# Patient Record
Sex: Male | Born: 1965 | Race: White | Hispanic: No | Marital: Married | Smoking: Never smoker
Health system: Southern US, Community
[De-identification: ages and names within clinical notes are randomized; demographics above are authoritative.]

## PROBLEM LIST (undated history)

## (undated) DIAGNOSIS — F32A Depression, unspecified: Secondary | ICD-10-CM

## (undated) DIAGNOSIS — F419 Anxiety disorder, unspecified: Secondary | ICD-10-CM

## (undated) DIAGNOSIS — F329 Major depressive disorder, single episode, unspecified: Secondary | ICD-10-CM

## (undated) DIAGNOSIS — E7800 Pure hypercholesterolemia, unspecified: Secondary | ICD-10-CM

---

## 2006-05-23 ENCOUNTER — Emergency Department: Payer: Self-pay | Admitting: Emergency Medicine

## 2008-07-22 ENCOUNTER — Ambulatory Visit: Payer: Self-pay | Admitting: Internal Medicine

## 2018-10-06 ENCOUNTER — Other Ambulatory Visit (HOSPITAL_COMMUNITY): Payer: Self-pay | Admitting: Gastroenterology

## 2018-10-06 ENCOUNTER — Other Ambulatory Visit: Payer: Self-pay | Admitting: Gastroenterology

## 2018-10-06 DIAGNOSIS — R1310 Dysphagia, unspecified: Secondary | ICD-10-CM

## 2018-10-08 ENCOUNTER — Ambulatory Visit
Admission: RE | Admit: 2018-10-08 | Discharge: 2018-10-08 | Disposition: A | Discharge status: Home / Self Care | Payer: BLUE CROSS/BLUE SHIELD | Source: Ambulatory Visit | Attending: Gastroenterology | Admitting: Gastroenterology

## 2018-10-08 DIAGNOSIS — R1310 Dysphagia, unspecified: Secondary | ICD-10-CM | POA: Diagnosis not present

## 2018-11-19 ENCOUNTER — Encounter: Payer: Self-pay | Admitting: *Deleted

## 2018-11-22 ENCOUNTER — Encounter
Admission: RE | Disposition: A | Discharge status: Home / Self Care | Payer: Self-pay | Source: Home / Self Care | Attending: Gastroenterology

## 2018-11-22 ENCOUNTER — Ambulatory Visit: Payer: BLUE CROSS/BLUE SHIELD | Admitting: Certified Registered"

## 2018-11-22 ENCOUNTER — Ambulatory Visit
Admission: RE | Admit: 2018-11-22 | Discharge: 2018-11-22 | Disposition: A | Discharge status: Home / Self Care | Payer: BLUE CROSS/BLUE SHIELD | Attending: Gastroenterology | Admitting: Gastroenterology

## 2018-11-22 ENCOUNTER — Other Ambulatory Visit: Payer: Self-pay

## 2018-11-22 ENCOUNTER — Encounter: Payer: Self-pay | Admitting: *Deleted

## 2018-11-22 DIAGNOSIS — K2980 Duodenitis without bleeding: Secondary | ICD-10-CM | POA: Insufficient documentation

## 2018-11-22 DIAGNOSIS — F329 Major depressive disorder, single episode, unspecified: Secondary | ICD-10-CM | POA: Diagnosis not present

## 2018-11-22 DIAGNOSIS — R1310 Dysphagia, unspecified: Secondary | ICD-10-CM | POA: Diagnosis not present

## 2018-11-22 DIAGNOSIS — E7800 Pure hypercholesterolemia, unspecified: Secondary | ICD-10-CM | POA: Diagnosis not present

## 2018-11-22 DIAGNOSIS — K644 Residual hemorrhoidal skin tags: Secondary | ICD-10-CM | POA: Insufficient documentation

## 2018-11-22 DIAGNOSIS — K449 Diaphragmatic hernia without obstruction or gangrene: Secondary | ICD-10-CM | POA: Insufficient documentation

## 2018-11-22 DIAGNOSIS — Z79899 Other long term (current) drug therapy: Secondary | ICD-10-CM | POA: Insufficient documentation

## 2018-11-22 DIAGNOSIS — Z1211 Encounter for screening for malignant neoplasm of colon: Secondary | ICD-10-CM | POA: Diagnosis present

## 2018-11-22 DIAGNOSIS — K2210 Ulcer of esophagus without bleeding: Secondary | ICD-10-CM | POA: Diagnosis not present

## 2018-11-22 DIAGNOSIS — K317 Polyp of stomach and duodenum: Secondary | ICD-10-CM | POA: Diagnosis not present

## 2018-11-22 DIAGNOSIS — K210 Gastro-esophageal reflux disease with esophagitis: Secondary | ICD-10-CM | POA: Diagnosis not present

## 2018-11-22 DIAGNOSIS — F419 Anxiety disorder, unspecified: Secondary | ICD-10-CM | POA: Diagnosis not present

## 2018-11-22 DIAGNOSIS — K228 Other specified diseases of esophagus: Secondary | ICD-10-CM | POA: Diagnosis not present

## 2018-11-22 DIAGNOSIS — K2960 Other gastritis without bleeding: Secondary | ICD-10-CM | POA: Insufficient documentation

## 2018-11-22 DIAGNOSIS — K225 Diverticulum of esophagus, acquired: Secondary | ICD-10-CM | POA: Insufficient documentation

## 2018-11-22 HISTORY — DX: Pure hypercholesterolemia, unspecified: E78.00

## 2018-11-22 HISTORY — DX: Anxiety disorder, unspecified: F41.9

## 2018-11-22 HISTORY — PX: COLONOSCOPY WITH PROPOFOL: SHX5780

## 2018-11-22 HISTORY — DX: Major depressive disorder, single episode, unspecified: F32.9

## 2018-11-22 HISTORY — DX: Depression, unspecified: F32.A

## 2018-11-22 HISTORY — PX: ESOPHAGOGASTRODUODENOSCOPY (EGD) WITH PROPOFOL: SHX5813

## 2018-11-22 SURGERY — ESOPHAGOGASTRODUODENOSCOPY (EGD) WITH PROPOFOL
Anesthesia: General

## 2018-11-22 MED ORDER — FENTANYL CITRATE (PF) 100 MCG/2ML IJ SOLN
INTRAMUSCULAR | Status: DC | PRN
  Administered 2018-11-22: 25 ug via INTRAVENOUS

## 2018-11-22 MED ORDER — SODIUM CHLORIDE 0.9 % IV SOLN: INTRAVENOUS | Status: DC

## 2018-11-22 MED ORDER — GLYCOPYRROLATE 0.2 MG/ML IJ SOLN
INTRAMUSCULAR | Status: DC | PRN
  Administered 2018-11-22: 0.2 mg via INTRAVENOUS

## 2018-11-22 MED ORDER — SODIUM CHLORIDE 0.9 % IV SOLN
INTRAVENOUS | Status: DC
  Administered 2018-11-22: 1000 mL via INTRAVENOUS

## 2018-11-22 MED ORDER — PROPOFOL 500 MG/50ML IV EMUL
INTRAVENOUS | Status: DC | PRN
  Administered 2018-11-22: 125 ug/kg/min via INTRAVENOUS

## 2018-11-22 MED ORDER — PROPOFOL 500 MG/50ML IV EMUL
INTRAVENOUS | Status: AC
  Filled 2018-11-22: qty 50

## 2018-11-22 MED ORDER — GLYCOPYRROLATE 0.2 MG/ML IJ SOLN
INTRAMUSCULAR | Status: AC
  Filled 2018-11-22: qty 1

## 2018-11-22 MED ORDER — LIDOCAINE HCL (PF) 2 % IJ SOLN
INTRAMUSCULAR | Status: AC
  Filled 2018-11-22: qty 10

## 2018-11-22 MED ORDER — PROPOFOL 10 MG/ML IV BOLUS
INTRAVENOUS | Status: DC | PRN
  Administered 2018-11-22 (×2): 50 mg via INTRAVENOUS

## 2018-11-22 MED ORDER — FENTANYL CITRATE (PF) 100 MCG/2ML IJ SOLN
INTRAMUSCULAR | Status: AC
  Filled 2018-11-22: qty 2

## 2018-11-22 MED ORDER — LIDOCAINE HCL (CARDIAC) PF 100 MG/5ML IV SOSY
PREFILLED_SYRINGE | INTRAVENOUS | Status: DC | PRN
  Administered 2018-11-22: 100 mg via INTRATRACHEAL

## 2018-11-22 NOTE — Op Note (Signed)
[facility] Patient Name: Christopher Huffman Procedure Date: 11/22/2018 10:03 AM MRN: [id number] Account #: [id number] Date of Birth: 02-24-1966 Admit Type: Outpatient Age: 53 Room: [facility] ENDO ROOM 3 Gender: Male Note Status: Finalized Procedure:            Colonoscopy Indications:          Screening for colorectal malignant neoplasm Providers:            Christena Deem, MD Medicines:            Monitored Anesthesia Care Complications:        No immediate complications. Procedure:            Pre-Anesthesia Assessment:                       - ASA Grade Assessment: II - A patient with mild                        systemic disease.                       After obtaining informed consent, the colonoscope was                        passed under direct vision. Throughout the procedure,                        the patient's blood pressure, pulse, and oxygen                        saturations were monitored continuously. The was                        introduced through the anus and advanced to the the                        cecum, identified by appendiceal orifice and ileocecal                        valve. The colonoscopy was performed without                        difficulty. The patient tolerated the procedure well.                        The quality of the bowel preparation was good. Findings:      The colon (entire examined portion) appeared normal.      The entire examined colon appeared normal.      The retroflexed view of the distal rectum and anal verge was normal and       showed no anal or rectal abnormalities.      Non-bleeding external hemorrhoids were found during perianal exam. The       hemorrhoids were small.      The digital rectal exam was normal otherwise. Impression:           - The entire examined colon is normal.                       - The entire examined colon is normal.                       -  The distal rectum and anal verge are  normal on                        retroflexion view.                       - Non-bleeding external hemorrhoids.                       - No specimens collected. Recommendation:       - Discharge patient to home.                       - Take Sitz baths BID for one week. Christena Deem, MD 11/22/2018 11:24:37 AM This report has been signed electronically. Number of Addenda: 0 Note Initiated On: 11/22/2018 10:03 AM Scope Withdrawal Time: 0 hours 7 minutes 17 seconds  Total Procedure Duration: 0 hours 15 minutes 31 seconds       [facility]

## 2018-11-22 NOTE — Transfer of Care (Signed)
Immediate Anesthesia Transfer of Care Note  Patient: Christopher Huffman  Procedure(s) Performed: ESOPHAGOGASTRODUODENOSCOPY (EGD) WITH PROPOFOL (N/A ) COLONOSCOPY WITH PROPOFOL (N/A )  Patient Location: Endoscopy Unit  Anesthesia Type:General  Level of Consciousness: awake  Airway & Oxygen Therapy: Patient Spontanous Breathing and Patient connected to nasal cannula oxygen  Post-op Assessment: Report given to RN and Post -op Vital signs reviewed and stable  Post vital signs: stable  Last Vitals:  Vitals Value Taken Time  BP 131/89 11/22/2018 11:30 AM  Temp 36.1 C 11/22/2018 11:30 AM  Pulse 89 11/22/2018 11:31 AM  Resp 12 11/22/2018 11:31 AM  SpO2 99 % 11/22/2018 11:31 AM  Vitals shown include unvalidated device data.  Last Pain:  Vitals:   11/22/18 1130  TempSrc: Tympanic  PainSc: 0-No pain         Complications: No apparent anesthesia complications

## 2018-11-22 NOTE — H&P (Signed)
Outpatient short stay form Pre-procedure 11/22/2018 10:19 AM Christena Deem MD  Primary Physician: No primary care provider  Reason for visit: EGD and colonoscopy  History of present illness: Patient is a 53 year old L presenting today for an EGD and colonoscopy in regards to colon cancer screening and history of some dysphagia.  She gets a fullness sensation sometimes after eating particularly with meats in the distal esophagus region/low retrosternal region.  His first colon cancer screening.  Did have a barium swallow on 10/08/2018 showing a Schatzki ring that did not obstruct the passage of the tablet.  He does have a hiatal hernia.  However he also has a small Zenker's diverticulum.  A very sharp angular transition the GE junction into the stomach.  Tolerated his prep well.  He takes no aspirin or blood thinning agent.  He has been prescribed pantoprazole however has been taking only irregularly.    Current Facility-Administered Medications:  .  0.9 %  sodium chloride infusion, , Intravenous, Continuous, Christena Deem, MD .  0.9 %  sodium chloride infusion, , Intravenous, Continuous, Christena Deem, MD, Last Rate: 20 mL/hr at 11/22/18 0865  Medications Prior to Admission  Medication Sig Dispense Refill Last Dose  . hydrOXYzine (ATARAX/VISTARIL) 25 MG tablet Take 25 mg by mouth 3 (three) times daily as needed.   Past Week at Unknown time  . pantoprazole (PROTONIX) 40 MG tablet Take 40 mg by mouth daily.   Past Week at Unknown time  . sertraline (ZOLOFT) 50 MG tablet Take 50 mg by mouth daily.   11/21/2018 at Unknown time  . triamcinolone cream (KENALOG) 0.1 % Apply 1 application topically 2 (two) times daily.   Past Week at Unknown time     No Known Allergies   Past Medical History:  Diagnosis Date  . Anxiety   . Depression   . Elevated cholesterol     Review of systems:      Physical Exam    Heart and lungs: Regular rate and rhythm without rub or gallop, lungs are  bilaterally clear.    HEENT: Normocephalic atraumatic eyes are anicteric    Other:    Pertinant exam for procedure: Soft nontender nondistended bowel sounds positive normoactive    Planned proceedures: EGD, colonoscopy and indicated procedures. I have discussed the risks benefits and complications of procedures to include not limited to bleeding, infection, perforation and the risk of sedation and the patient wishes to proceed.    Christena Deem, MD Gastroenterology 11/22/2018  10:19 AM

## 2018-11-22 NOTE — Anesthesia Preprocedure Evaluation (Addendum)
Anesthesia Evaluation  Patient identified by MRN, date of birth, ID band Patient awake    Reviewed: Allergy & Precautions, H&P , NPO status , Patient's Chart, lab work & pertinent test results, reviewed documented beta blocker date and time   History of Anesthesia Complications Negative for: history of anesthetic complications  Airway Mallampati: II  TM Distance: >3 FB Neck ROM: full    Dental  (+) Dental Advidsory Given, Caps, Teeth Intact, Chipped   Pulmonary neg pulmonary ROS,           Cardiovascular Exercise Tolerance: Good negative cardio ROS       Neuro/Psych PSYCHIATRIC DISORDERS Anxiety Depression negative neurological ROS     GI/Hepatic Neg liver ROS, GERD  ,  Endo/Other  negative endocrine ROS  Renal/GU negative Renal ROS  negative genitourinary   Musculoskeletal   Abdominal   Peds  Hematology negative hematology ROS (+)   Anesthesia Other Findings Past Medical History: No date: Anxiety No date: Depression No date: Elevated cholesterol   Reproductive/Obstetrics negative OB ROS                            Anesthesia Physical Anesthesia Plan  ASA: II  Anesthesia Plan: General   Post-op Pain Management:    Induction: Intravenous  PONV Risk Score and Plan: 2 and Propofol infusion and TIVA  Airway Management Planned: Natural Airway and Nasal Cannula  Additional Equipment:   Intra-op Plan:   Post-operative Plan:   Informed Consent: I have reviewed the patients History and Physical, chart, labs and discussed the procedure including the risks, benefits and alternatives for the proposed anesthesia with the patient or authorized representative who has indicated his/her understanding and acceptance.     Dental Advisory Given  Plan Discussed with: Anesthesiologist, CRNA and Surgeon  Anesthesia Plan Comments:         Anesthesia Quick Evaluation

## 2018-11-22 NOTE — Anesthesia Post-op Follow-up Note (Signed)
Anesthesia QCDR form completed.        

## 2018-11-22 NOTE — Op Note (Signed)
[facility] Patient Name: Christopher Huffman Procedure Date: 11/22/2018 10:04 AM MRN: [id number] Account #: [id number] Date of Birth: November 02, 1965 Admit Type: Outpatient Age: 53 Room: [facility] ENDO ROOM 3 Gender: Male Note Status: Finalized Procedure:            Upper GI endoscopy Indications:          Dysphagia Providers:            Christena Deem, MD Medicines:            Monitored Anesthesia Care Complications:        No immediate complications. Procedure:            Pre-Anesthesia Assessment:                       - ASA Grade Assessment: II - A patient with mild                        systemic disease.                       After obtaining informed consent, the endoscope was                        passed under direct vision. Throughout the procedure,                        the patient's blood pressure, pulse, and oxygen                        saturations were monitored continuously. The Endoscope                        was introduced through the mouth, and advanced to the                        third part of duodenum. The patient tolerated the                        procedure well. Findings:      LA Grade C (one or more mucosal breaks continuous between tops of 2 or       more mucosal folds, less than 75% circumference) esophagitis with no       bleeding was found. Biopsies were taken with a cold forceps for       histology.      Diffuse mild inflammation characterized by congestion (edema), erosions       and erythema was found in the gastric antrum. Biopsies were taken with a       cold forceps for histology. Biopsies were taken with a cold forceps for       Helicobacter pylori testing.      A small hiatal hernia was found. The Z-line was a variable distance from       incisors; the hiatal hernia was sliding.      A single 4 mm sessile polyp with inflammation and no stigmata of recent       bleeding was found on the anterior wall of the gastric  body. The polyp       was removed with a piecemeal technique using a cold biopsy forceps.       Resection and retrieval were complete.  The cardia and gastric fundus were normal on retroflexion otherwise.      Patchy mild inflammation characterized by congestion (edema) and       erythema was found in the duodenal bulb and in the second portion of the       duodenum.      The exam of the duodenum was otherwise normal.      Mucosal changes including feline appearance and longitudinal furrows       were found in the upper third of the esophagus, in the middle third of       the esophagus and in the lower third of the esophagus. Esophageal       findings were graded using the Eosinophilic Esophagitis Endoscopic       Reference Score (EoE-EREFS) as: Edema Grade 1 Present (decreased clarity       or absence of vascular markings), Rings Grade 1 Mild (subtle       circumferential ridges seen on esophageal distension), Exudates Grade 0       None (no white lesions seen), Furrows Grade 1 Present (vertical lines       with or without visible depth) and Stricture none (no stricture found).       Biopsies were obtained from the proximal and distal esophagus with cold       forceps for histology of suspected eosinophilic esophagitis.      A few 1 to 2 mm mucosal nodules were found in the proximal esophagus, 25       cm from the incisors. Biopsies/removal were taken with a cold forceps       for histology. Impression:           - LA Grade C erosive esophagitis. Biopsied.                       - Erosive gastritis. Biopsied.                       - Small hiatal hernia.                       - A single gastric polyp. Resected and retrieved.                       - Duodenitis.                       - Esophageal mucosal changes suggestive of eosinophilic                        esophagitis. Biopsied.                       - Mucosal nodule found in the esophagus. Biopsied. Recommendation:       -  Discharge patient to home. Christena Deem, MD 11/22/2018 11:02:28 AM This report has been signed electronically. Number of Addenda: 0 Note Initiated On: 11/22/2018 10:04 AM      [facility]

## 2018-11-23 NOTE — Anesthesia Postprocedure Evaluation (Addendum)
Anesthesia Post Note  Patient: Christopher Huffman  Procedure(s) Performed: ESOPHAGOGASTRODUODENOSCOPY (EGD) WITH PROPOFOL (N/A ) COLONOSCOPY WITH PROPOFOL (N/A )  Patient location during evaluation: Endoscopy Anesthesia Type: General Level of consciousness: awake and alert Pain management: pain level controlled Vital Signs Assessment: post-procedure vital signs reviewed and stable Respiratory status: spontaneous breathing, nonlabored ventilation, respiratory function stable and patient connected to nasal cannula oxygen Cardiovascular status: blood pressure returned to baseline and stable Postop Assessment: no apparent nausea or vomiting Anesthetic complications: no     Last Vitals:  Vitals:   11/22/18 1150 11/22/18 1200  BP: (!) 141/92 (!) 135/99  Pulse: 72 68  Resp: 14 17  Temp:    SpO2: 100% 100%    Last Pain:  Vitals:   11/22/18 1200  TempSrc:   PainSc: 0-No pain                 Lenard Simmer

## 2018-11-24 ENCOUNTER — Encounter: Payer: Self-pay | Admitting: Gastroenterology

## 2018-11-24 LAB — SURGICAL PATHOLOGY

## 2020-08-22 IMAGING — RF DG ESOPHAGUS
9 series · 14 of 23 positions shown · non-contrast
Comparison: None.

CLINICAL DATA: Dysphagia for several months

EXAM:
ESOPHOGRAM / BARIUM SWALLOW / BARIUM TABLET STUDY
TECHNIQUE: Combined double contrast and single contrast examination performed
using thick liquid barium and thin barium liquid. The patient was
observed with fluoroscopy swallowing a 13 mm barium sulphate tablet.
FLUOROSCOPY TIME:  Fluoroscopy Time:  1 minutes 12 seconds
Radiation Exposure Index (if provided by the fluoroscopic device):
56.00 mGy
Number of Acquired Spot Images: 26

[Series 1: fluoro_barium 2fps_bw · 0.18mm/px · 2 of 13 frames shown (1 of 9)]
[frame 2/13]
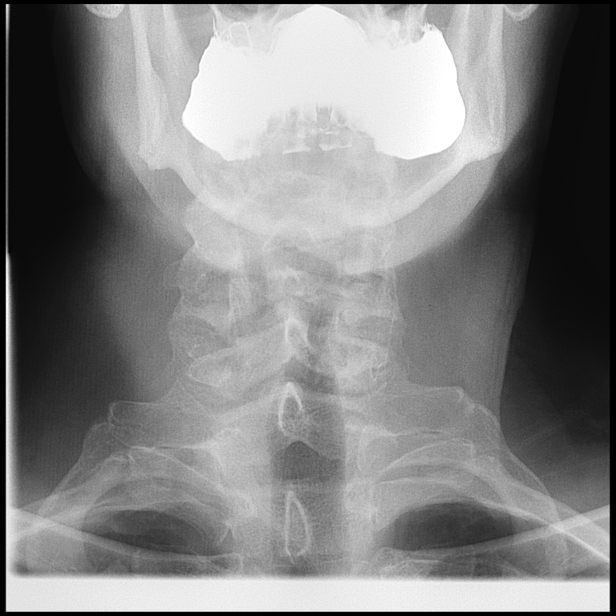
[frame 10/13]
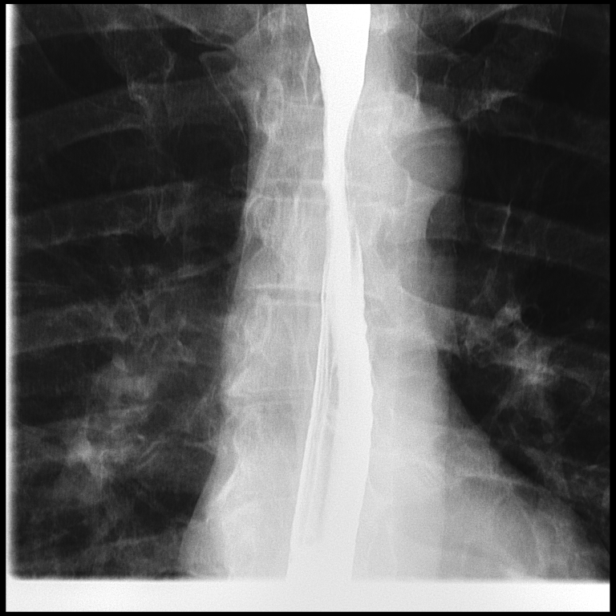

[Series 2: fluoro_barium 2fps_bw · 0.19mm/px · 1 of 1 slices shown (2 of 9)]
[im 1/1]
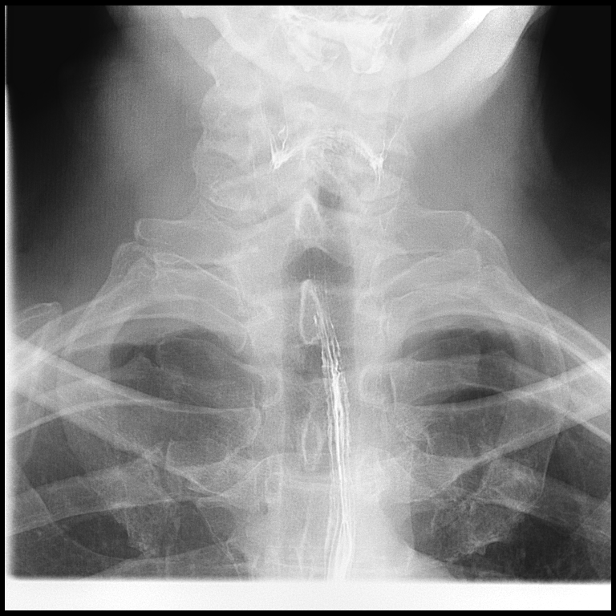

[Series 3: fluoro_barium 2fps_bw · 0.19mm/px · 2 of 13 frames shown (3 of 9)]
[frame 2/13]
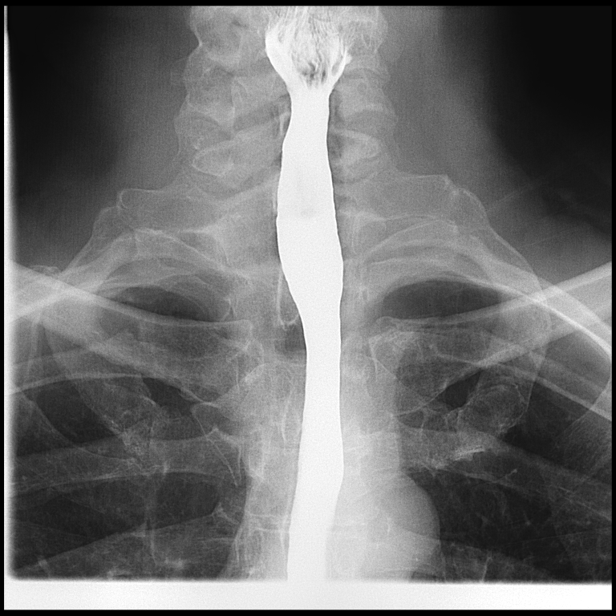
[frame 7/13]
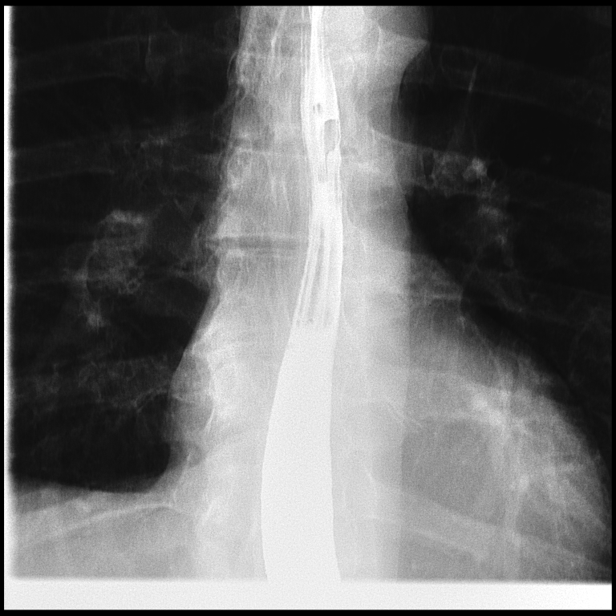

[Series 4: fluoro_barium 2fps_bw · 0.19mm/px · 1 of 1 slices shown (4 of 9)]
[im 1/1]
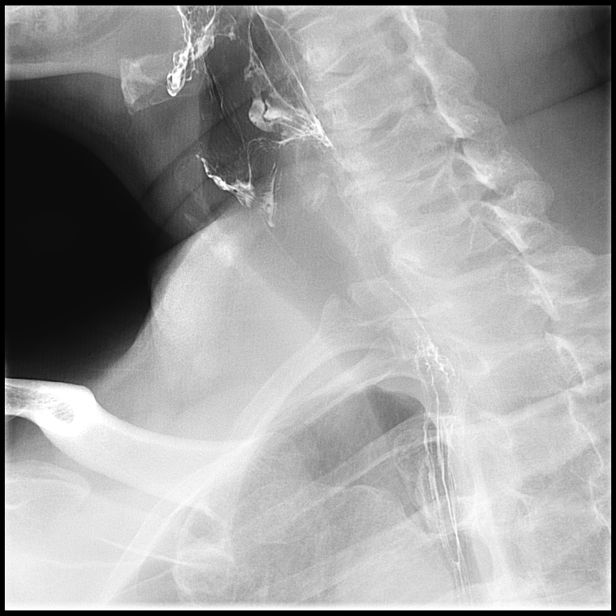

[Series 5: fluoro_barium 2fps_bw · 0.19mm/px · 3 of 18 frames shown (5 of 9)]
[frame 3/18]
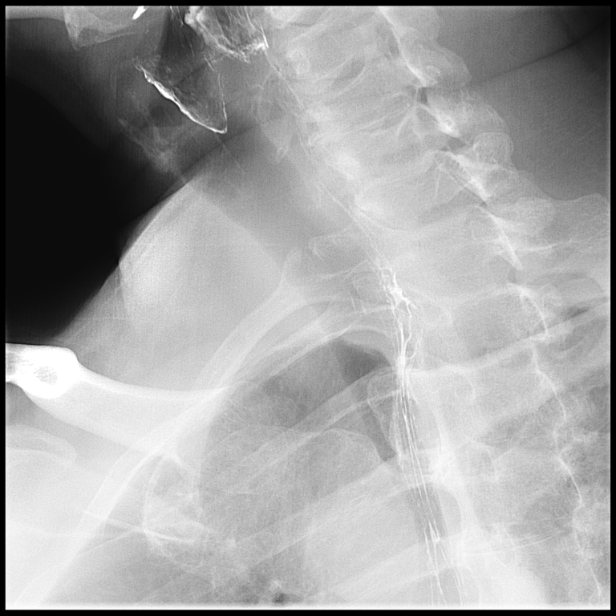
[frame 10/18]
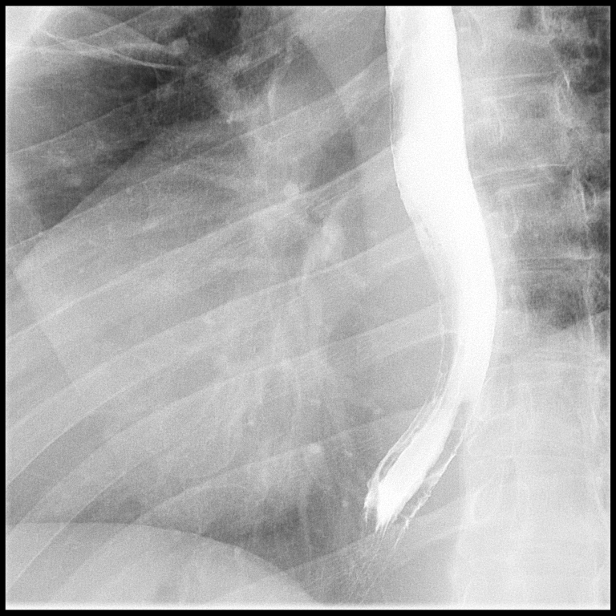
[frame 16/18]
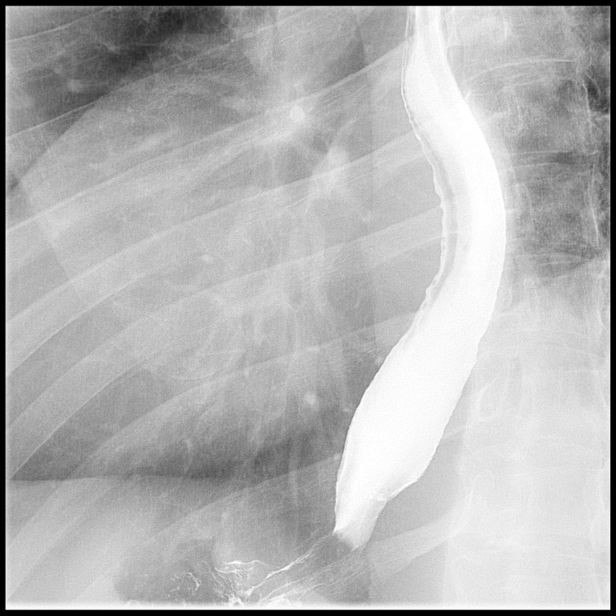

[Series 6: fluoro_barium 2fps_bw · 0.19mm/px · 2 of 5 frames shown (6 of 9)]
[frame 2/5]
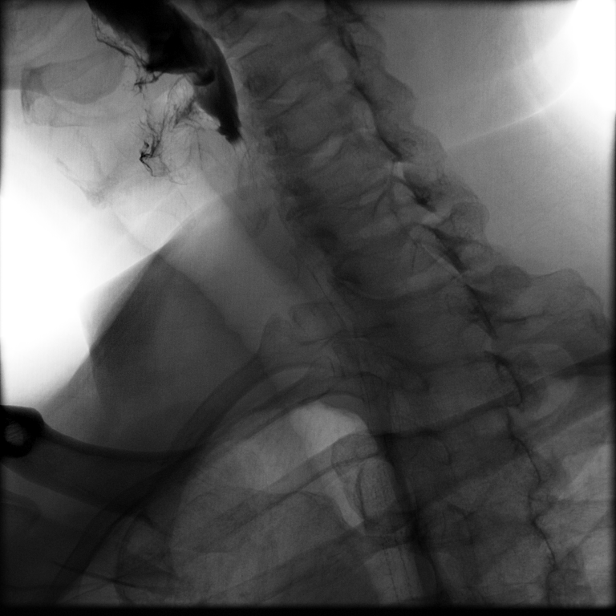
[frame 5/5]
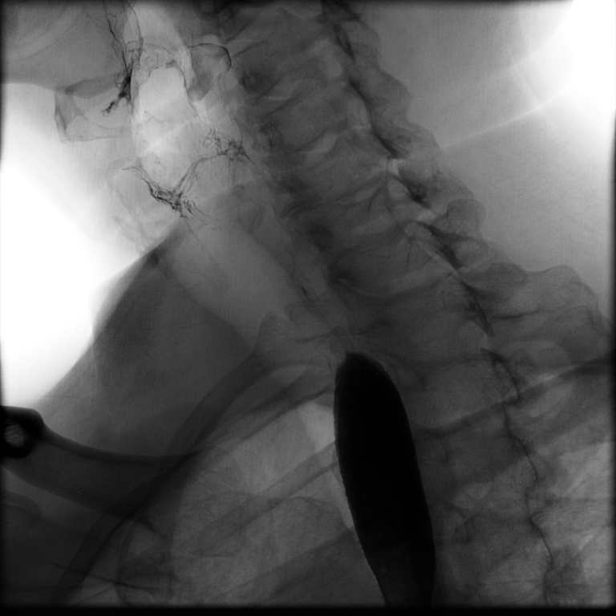

[Series 7: fluoro_barium 2fps_bw · 0.19mm/px · 1 of 2 frames shown (7 of 9)]
[frame 1/2]
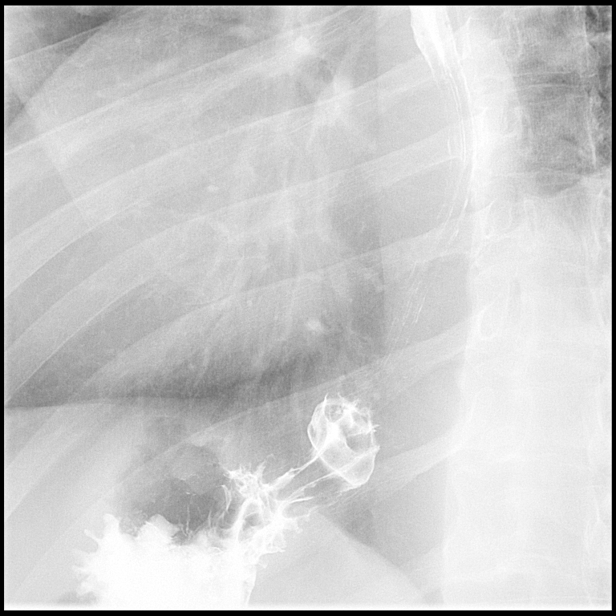

[Series 8: fluoro_barium 2fps_bw · 0.19mm/px · 1 of 2 frames shown (8 of 9)]
[frame 1/2]
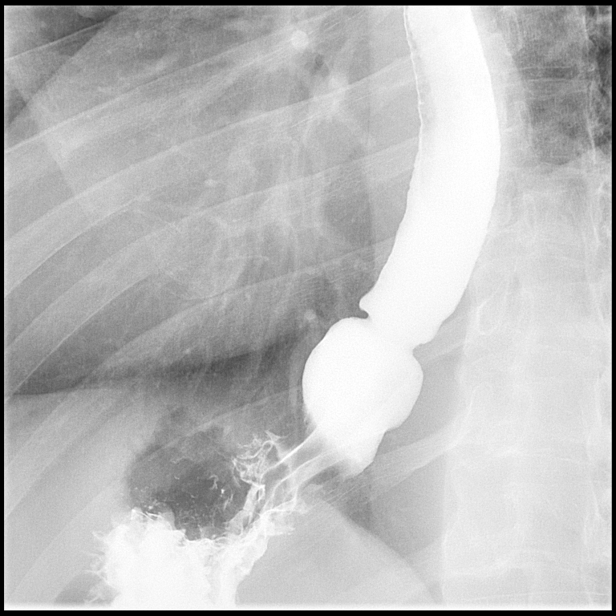

[Series 9: fluoro_barium 2fps_bw · 0.19mm/px · 1 of 1 slices shown (9 of 9)]
[im 1/1]
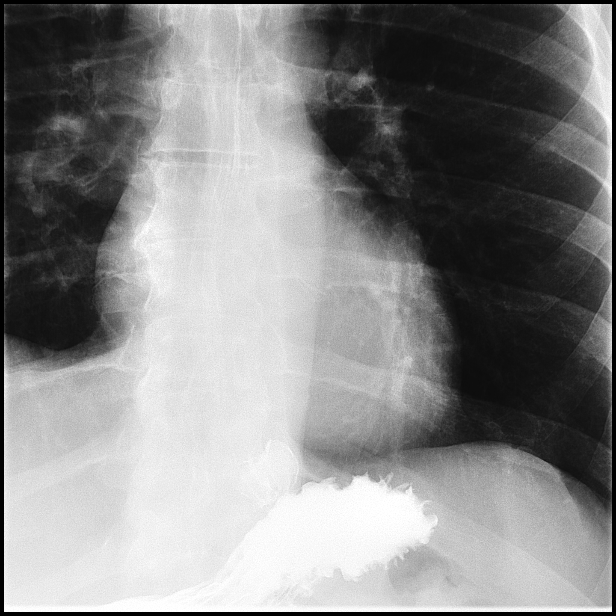

[14 of 23 positions shown; findings below may reference images not displayed]

FINDINGS: The patient swallows without difficulty. Esophageal motility is
within normal limits. There is a small hiatal hernia with a Schatzki
ring immediately proximal to the hiatal hernia. There is reflux of
an incomplete column of barium into the lower esophagus which
empties rapidly. There is no esophageal stricture/narrowing beyond
mild narrowing at the Schatzki ring. There is no esophageal mass or
ulceration. The pharynx appears normal.

A 13 mm barium tablet passed freely through the esophagus into the
stomach intact. In particular, the tablet did not retain at the
Schatzki ring.

There is a slightly less than 1 cm in size posterior
pharyngeal/Kaki diverticulum. There is a 1 cm diverticulum along
the left posterolateral pharynx as well.
IMPRESSION: Small hiatal hernia with a Schatzki ring distally. A 13 mm barium
tablet passed freely through the Schatzki ring region. No other
esophageal narrowing/stricture. No mass or ulceration. Esophageal
motility normal. Modest spontaneous reflux.

Jungers Neus diverticulum in the pharyngeal region. Slightly
larger posterolateral left-sided pharyngeal diverticulum with a wide
neck, measuring approximately 1 cm.

## 2024-03-28 ENCOUNTER — Other Ambulatory Visit: Payer: Self-pay | Admitting: Gerontology

## 2024-03-28 ENCOUNTER — Ambulatory Visit
Admission: RE | Admit: 2024-03-28 | Discharge: 2024-03-28 | Disposition: A | Discharge status: Home / Self Care | Source: Ambulatory Visit | Attending: Gerontology | Admitting: Gerontology

## 2024-03-28 DIAGNOSIS — M25522 Pain in left elbow: Secondary | ICD-10-CM | POA: Insufficient documentation

## 2024-04-22 ENCOUNTER — Other Ambulatory Visit: Payer: Self-pay | Admitting: Orthopedic Surgery

## 2024-04-22 DIAGNOSIS — S46002D Unspecified injury of muscle(s) and tendon(s) of the rotator cuff of left shoulder, subsequent encounter: Secondary | ICD-10-CM

## 2024-04-22 DIAGNOSIS — M25312 Other instability, left shoulder: Secondary | ICD-10-CM

## 2024-04-22 DIAGNOSIS — S42022D Displaced fracture of shaft of left clavicle, subsequent encounter for fracture with routine healing: Secondary | ICD-10-CM

## 2024-04-22 DIAGNOSIS — S42112D Displaced fracture of body of scapula, left shoulder, subsequent encounter for fracture with routine healing: Secondary | ICD-10-CM

## 2024-04-29 ENCOUNTER — Ambulatory Visit
Admission: RE | Admit: 2024-04-29 | Discharge: 2024-04-29 | Disposition: A | Discharge status: Home / Self Care | Source: Ambulatory Visit | Attending: Orthopedic Surgery | Admitting: Orthopedic Surgery

## 2024-04-29 DIAGNOSIS — M25312 Other instability, left shoulder: Secondary | ICD-10-CM

## 2024-04-29 DIAGNOSIS — S46002D Unspecified injury of muscle(s) and tendon(s) of the rotator cuff of left shoulder, subsequent encounter: Secondary | ICD-10-CM

## 2024-04-29 DIAGNOSIS — S42022D Displaced fracture of shaft of left clavicle, subsequent encounter for fracture with routine healing: Secondary | ICD-10-CM

## 2024-04-29 DIAGNOSIS — S42112D Displaced fracture of body of scapula, left shoulder, subsequent encounter for fracture with routine healing: Secondary | ICD-10-CM
